# Patient Record
Sex: Female | Born: 2005 | Hispanic: Yes | Marital: Single | Smoking: Never smoker
Health system: Southern US, Community
[De-identification: ages and names within clinical notes are randomized; demographics above are authoritative.]

## PROBLEM LIST (undated history)

## (undated) DIAGNOSIS — G43909 Migraine, unspecified, not intractable, without status migrainosus: Secondary | ICD-10-CM

## (undated) HISTORY — DX: Migraine, unspecified, not intractable, without status migrainosus: G43.909

## (undated) HISTORY — PX: OTHER SURGICAL HISTORY: SHX169

---
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
## 2024-01-26 ENCOUNTER — Other Ambulatory Visit: Payer: Self-pay | Admitting: Family

## 2024-01-26 DIAGNOSIS — N946 Dysmenorrhea, unspecified: Secondary | ICD-10-CM

## 2024-02-09 ENCOUNTER — Encounter: Payer: Self-pay | Admitting: Family

## 2024-04-11 ENCOUNTER — Ambulatory Visit (INDEPENDENT_AMBULATORY_CARE_PROVIDER_SITE_OTHER): Payer: Self-pay | Admitting: Pediatrics

## 2024-04-20 ENCOUNTER — Other Ambulatory Visit: Payer: Self-pay | Admitting: Family

## 2024-04-20 DIAGNOSIS — N946 Dysmenorrhea, unspecified: Secondary | ICD-10-CM

## 2024-04-24 ENCOUNTER — Encounter (INDEPENDENT_AMBULATORY_CARE_PROVIDER_SITE_OTHER): Payer: Self-pay | Admitting: Pediatrics

## 2024-04-24 ENCOUNTER — Ambulatory Visit (INDEPENDENT_AMBULATORY_CARE_PROVIDER_SITE_OTHER): Payer: Self-pay | Admitting: Pediatrics

## 2024-04-24 VITALS — BP 100/70 | HR 84 | Ht 60.12 in | Wt 120.8 lb

## 2024-04-24 DIAGNOSIS — G43009 Migraine without aura, not intractable, without status migrainosus: Secondary | ICD-10-CM

## 2024-04-24 DIAGNOSIS — G44209 Tension-type headache, unspecified, not intractable: Secondary | ICD-10-CM | POA: Diagnosis not present

## 2024-04-24 MED ORDER — NERIVIO DEVI: 12 refills | Status: AC

## 2024-04-24 MED ORDER — ONDANSETRON HCL 8 MG PO TABS: 8.0000 mg | ORAL_TABLET | Freq: Three times a day (TID) | ORAL | 5 refills | Status: AC | PRN

## 2024-04-24 MED ORDER — RIZATRIPTAN BENZOATE 10 MG PO TBDP: 10.0000 mg | ORAL_TABLET | ORAL | 0 refills | Status: AC | PRN

## 2024-04-24 NOTE — Progress Notes (Signed)
 Patient: Alexis Anderson MRN: [id number] Sex: female DOB: 2006/09/12  Provider: Asberry Moles, NP Location of Care: [facility]  Note type: Routine follow-up  History of Present Illness:  Alexis Anderson is a 18 y.o. female with history of migraine without aura and tension-type headache who I am seeing for routine follow-up. Patient was last seen on 10/10/2023 where Alexis Anderson was discussed for headache prevention.  Since the last appointment, she reports headaches have continued averaging once per week to once every other week. When she experiences headache she will take benadryl  and zofran  and headache resolves. She reports wearing hair up at work could trigger headaches. She is sleeping well at night. She has a good appetite and drinks water. She has been travelling for fun. They were unable to obtain Alexis Anderson due to cost after last appointment and would like more information to see if coverage has changed now. No other questions or concerns for today's visit.   Patient presents today with mother.     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 135 tablet 1   ibuprofen  (ADVIL ) 200 MG tablet Take 200 mg by mouth every 6 (six) hours as needed.     ondansetron  (ZOFRAN ) 8 MG tablet Take 1 tablet (8 mg total) by mouth every 8 (eight) hours as needed. 20 tablet 0   ZAFEMY 150-35 MCG/24HR transdermal patch APPLY 1 PATCH TOPICALLY ONCE A WEEK 12 patch 0   No current facility-administered medications on file prior  to visit.    Birth History she was born full-term via c-section delivery with no perinatal events. her birth weight was 6 lbs. 5oz.    Developmental history: she achieved developmental milestone at appropriate age.    Family History family history includes Alcohol abuse in her father; Arthritis in her maternal grandmother; Heart disease in her mother; Hypertension in her mother; Learning disabilities in her brother.  There is no family history of speech delay, learning difficulties in school, intellectual disability, epilepsy or neuromuscular disorders.   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  HENT: Negative for congestion, ear pain, hearing loss, sinus pain and sore throat.   Eyes: Negative for blurred vision, double vision, photophobia, discharge and redness.  Respiratory: Negative for cough, shortness of breath and wheezing.   Cardiovascular: Negative for chest pain, palpitations and leg swelling.  Gastrointestinal: Negative for abdominal pain, blood in stool, constipation, nausea and vomiting.  Genitourinary: Negative for dysuria and frequency.  Musculoskeletal: Negative for back pain, falls, joint pain and neck pain.  Skin: Negative for rash.  Neurological: Negative for dizziness, tremors, focal weakness, seizures, weakness and headaches.  Psychiatric/Behavioral: Negative for memory loss. The patient is not nervous/anxious and does not have insomnia.   Physical Exam BP  100/70 (BP Location: Right Arm, Patient Position: Sitting, Cuff Size: Normal)   Pulse 84   Ht 5' 0.12 (1.527 m)   Wt 120 lb 12.8 oz (54.8 kg)   LMP 03/28/2024 (Approximate)   BMI 23.50 kg/m   Gen: well appearing female Skin: No rash, No  neurocutaneous stigmata. HEENT: Normocephalic, no dysmorphic features, no conjunctival injection, nares patent, mucous membranes moist, oropharynx clear. Neck: Supple, no meningismus. No focal tenderness. Resp: Clear to auscultation bilaterally CV: Regular rate, normal S1/S2, no murmurs, no rubs Abd: BS present, abdomen soft, non-tender, non-distended. No hepatosplenomegaly or mass Ext: Warm and well-perfused. No deformities, no muscle wasting, ROM full.  Neurological Examination: MS: Awake, alert, interactive. Normal eye contact, answered the questions appropriately for age, speech was fluent,  Normal comprehension.  Attention and concentration were normal. Cranial Nerves: Pupils were equal and reactive to light;  EOM normal, no nystagmus; no ptsosis, intact facial sensation, face symmetric with full strength of facial muscles, palate elevation is symmetric.  Sternocleidomastoid and trapezius are with normal strength. Motor-Normal tone throughout, Normal strength in all muscle groups. No abnormal movements Sensation: Intact to light touch throughout.  Romberg negative. Coordination: No dysmetria on FTN test. Fine finger movements and rapid alternating movements are within normal range.  Mirror movements are not present.  There is no evidence of tremor, dystonic posturing or any abnormal movements.No difficulty with balance when standing on one foot bilaterally.   Gait: Normal gait. Tandem gait was normal.   Assessment 1. Migraine without aura and without status migrainosus, not intractable   2. Tension-type headache, not intractable, unspecified chronicity pattern     Alexis Anderson is a 18 y.o. female with history of migraine without aura and tension-type headache who presents for follow-up evaluation. She has averaged around 1 headache per week since last appointment that can be resolved with combination of benadryl  and zofran . Physical and neurological exam unremarkable. Would recommend  Alexis Anderson for headache prevention. Provided information to family to see if coverage has changed as previously insurance did not cover device. At onset of severe headache can use Alexis Anderson  for relief. Counseled on dose and frequency. Encouraged to have adequate sleep, hydration, and limited screen time for headache prevention. Follow-up in 6 months.    PLAN: Alexis Anderson for headache prevention At onset of severe headache can use Alexis Anderson  for relief Have appropriate hydration and sleep and limited screen time Make a headache diary May take occasional Tylenol  or ibuprofen  for moderate to severe headache, maximum 2 or 3 times a week Return for follow-up visit in 6 months    Counseling/Education: medication dose and administration   Total time spent with the patient was 30 minutes, of which 50% or more was spent in counseling and coordination of care.   The plan of care was discussed, with acknowledgement of understanding expressed by her mother.   Asberry Moles, DNP, CPNP-PC [facility] Pediatric Neurology  [phone] [address], [place], [place] [address] Phone: [phone]

## 2024-06-29 ENCOUNTER — Encounter: Payer: Self-pay | Admitting: *Deleted

## 2024-07-09 ENCOUNTER — Other Ambulatory Visit: Payer: Self-pay | Admitting: Family

## 2024-07-09 DIAGNOSIS — N946 Dysmenorrhea, unspecified: Secondary | ICD-10-CM

## 2024-07-11 ENCOUNTER — Encounter (INDEPENDENT_AMBULATORY_CARE_PROVIDER_SITE_OTHER): Payer: Self-pay

## 2024-07-12 ENCOUNTER — Encounter (INDEPENDENT_AMBULATORY_CARE_PROVIDER_SITE_OTHER): Payer: Self-pay

## 2024-07-23 ENCOUNTER — Telehealth: Payer: Self-pay | Admitting: Pediatrics

## 2024-07-23 ENCOUNTER — Ambulatory Visit: Admitting: Pediatrics

## 2024-07-23 ENCOUNTER — Telehealth: Payer: Self-pay | Admitting: Family

## 2024-07-23 ENCOUNTER — Encounter: Payer: Self-pay | Admitting: Pediatrics

## 2024-07-23 VITALS — BP 104/68 | Ht 60.25 in | Wt 123.6 lb

## 2024-07-23 DIAGNOSIS — Z68.41 Body mass index (BMI) pediatric, 5th percentile to less than 85th percentile for age: Secondary | ICD-10-CM

## 2024-07-23 DIAGNOSIS — Z0000 Encounter for general adult medical examination without abnormal findings: Secondary | ICD-10-CM | POA: Diagnosis not present

## 2024-07-23 NOTE — Progress Notes (Unsigned)
 Subjective:     Alexis Anderson is a 18 y.o. female and is here for a comprehensive physical exam. The patient reports no problems.  Social History   Socioeconomic History   Marital status: Single    Spouse name: Not on file   Number of children: Not on file   Years of education: Not on file   Highest education level: Not on file  Occupational History   Not on file  Tobacco Use   Smoking status: Never    Passive exposure: Never   Smokeless tobacco: Never   Tobacco comments:    mom using Vaps  Vaping Use   Vaping status: Never Used  Substance and Sexual Activity   Alcohol use: Never   Drug use: Never   Sexual activity: Never    Birth control/protection: None  Other Topics Concern   Not on file  Social History Narrative   Seena is biologic father, has never been involved      Lives with mom and 4 siblings    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   Social Drivers of Health   Financial Resource Strain: Not on file  Food Insecurity: Not on file  Transportation Needs: Not on file  Physical Activity: Not on file  Stress: Not on file  Social Connections: Not on file  Intimate Partner Violence: Not on file   Health Maintenance  Topic Date Due   HIV Screening  Never done   Hepatitis C Screening  Never done   Influenza Vaccine  05/11/2024   COVID-19 Vaccine (3 - 2025-26 season) 06/11/2024   DTaP/Tdap/Td (5 - Td or Tdap) 09/10/2026   Pneumococcal Vaccine  Completed   Hepatitis B Vaccines 19-59 Average Risk  Completed   HPV VACCINES  Completed   Meningococcal B Vaccine  Completed    The following portions of the patient's history were reviewed and updated as appropriate: allergies, current medications, past family history, past medical history, past social history, past surgical history, and problem list.  Review of Systems Pertinent items are noted in HPI.   Objective:    BP 104/68   Ht 5' 0.25 (1.53 m)   Wt 123 lb 9 oz (56 kg)   BMI 23.93  kg/m  General appearance: alert, cooperative, appears stated age, and no distress Head: Normocephalic, without obvious abnormality, atraumatic Eyes: conjunctivae/corneas clear. PERRL, EOM's intact. Fundi benign. Ears: normal TM's and external ear canals both ears Nose: Nares normal. Septum midline. Mucosa normal. No drainage or sinus tenderness. Throat: lips, mucosa, and tongue normal; teeth and gums normal Neck: no adenopathy, no carotid bruit, no JVD, supple, symmetrical, trachea midline, and thyroid not enlarged, symmetric, no tenderness/mass/nodules Lungs: clear to auscultation bilaterally Heart: regular rate and rhythm, S1, S2 normal, no murmur, click, rub or gallop and normal apical impulse Abdomen: soft, non-tender; bowel sounds normal; no masses,  no organomegaly Extremities: extremities normal, atraumatic, no cyanosis or edema Skin: Skin color, texture, turgor normal. No rashes or lesions Neurologic: Grossly normal    Assessment:    Healthy female exam.      Plan:    Discussed transition primary care to family practice See After Visit Summary for Counseling Recommendations

## 2024-07-23 NOTE — Telephone Encounter (Signed)
 Patient stated she gave the wrong pharmacy during today's wellness visit. Patient states she would like any prescriptions sent to the [facility] in [place] located at [address]

## 2024-07-23 NOTE — Patient Instructions (Signed)
 At [facility] we value your feedback. You may receive a survey about your visit today. Please share your experience as we strive to create trusting relationships with our patients to provide genuine, compassionate, quality care.   Preventive Care 43-19 Years Old, Female Preventive care refers to lifestyle choices and visits with your health care provider that can promote health and wellness. At this stage in your life, you may start seeing a primary care physician instead of a pediatrician for your preventive care. Preventive care visits are also called wellness exams. What can I expect for my preventive care visit? Counseling During your preventive care visit, your health care provider may ask about your: Medical history, including: Past medical problems. Family medical history. Pregnancy history. Current health, including: Menstrual cycle. Method of birth control. Emotional well-being. Home life and relationship well-being. Sexual activity and sexual health. Lifestyle, including: Alcohol, nicotine or tobacco, and drug use. Access to firearms. Diet, exercise, and sleep habits. Sunscreen use. Motor vehicle safety. Physical exam Your health care provider may check your: Height and weight. These may be used to calculate your BMI (body mass index). BMI is a measurement that tells if you are at a healthy weight. Waist circumference. This measures the distance around your waistline. This measurement also tells if you are at a healthy weight and may help predict your risk of certain diseases, such as type 2 diabetes and high blood pressure. Heart rate and blood pressure. Body temperature. Skin for abnormal spots. Breasts. What immunizations do I need? Vaccines are usually given at various ages, according to a schedule. Your health care provider will recommend vaccines for you based on your age, medical history, and lifestyle or other factors, such as travel or where you  work. What tests do I need? Screening Your health care provider may recommend screening tests for certain conditions. This may include: Vision and hearing tests. Lipid and cholesterol levels. Pelvic exam and Pap test. Hepatitis B test. Hepatitis C test. HIV (human immunodeficiency virus) test. STI (sexually transmitted infection) testing, if you are at risk. Tuberculosis skin test if you have symptoms. BRCA-related cancer screening. This may be done if you have a family history of breast, ovarian, tubal, or peritoneal cancers. Talk with your health care provider about your test results, treatment options, and if necessary, the need for more tests. Follow these instructions at home: Eating and drinking Eat a healthy diet that includes fresh fruits and vegetables, whole grains, lean protein, and low-fat dairy products. Drink enough fluid to keep your urine pale yellow. Do not drink alcohol if: Your health care provider tells you not to drink. You are pregnant, may be pregnant, or are planning to become pregnant. You are under the legal drinking age. In the [place] the legal drinking age is 19. If you drink alcohol: Limit how much you have to 0-1 drink a day. Know how much alcohol is in your drink. In the [place] one drink equals one 12 oz bottle of beer (355 mL), one 5 oz glass of wine (148 mL), or one 1 oz glass of hard liquor (44 mL). Lifestyle Brush your teeth every morning and night with fluoride toothpaste. Floss one time each day. Exercise for at least 30 minutes 19 or more days of the week. Do not use any products that contain nicotine or tobacco. These products include cigarettes, chewing tobacco, and vaping devices, such as e-cigarettes. If you need help quitting, ask your health care provider. Do not use drugs. If you are  sexually active, practice safe sex. Use a condom or other form of protection to prevent STIs. If you do not wish to become pregnant, use a form of birth control.  If you plan to become pregnant, see your health care provider for a prepregnancy visit. Find healthy ways to manage stress, such as: Meditation, yoga, or listening to music. Journaling. Talking to a trusted person. Spending time with friends and family. Safety Always wear your seat belt while driving or riding in a vehicle. Do not drive: If you have been drinking alcohol. Do not ride with someone who has been drinking. When you are tired or distracted. While texting. If you have been using any mind-altering substances or drugs. Wear a helmet and other protective equipment during sports activities. If you have firearms in your house, make sure you follow all gun safety procedures. Seek help if you have been bullied, physically abused, or sexually abused. Use the internet responsibly to avoid dangers, such as online bullying and online sex predators. What's next? Go to your health care provider once a year for an annual wellness visit. Ask your health care provider how often you should have your eyes and teeth checked. Stay up to date on all vaccines. This information is not intended to replace advice given to you by your health care provider. Make sure you discuss any questions you have with your health care provider. Document Revised: 03/25/2021 Document Reviewed: 03/25/2021 Elsevier Patient Education  2024 [employer].

## 2024-07-23 NOTE — Telephone Encounter (Signed)
 CALL BACK NUMBER:  [phone]  MEDICATION(S): ZAFEMY  PREFERRED PHARMACY: [address] [place] [address]  ARE YOU CURRENTLY COMPLETELY OUT OF THE MEDICATION? :  yes

## 2024-07-24 NOTE — Telephone Encounter (Signed)
Pharmacy updated in patient's chart.

## 2024-07-26 NOTE — Telephone Encounter (Signed)
 Spoke to [place] and she will call back Monday to schedule an appointment after she gets her new work schedule.

## 2024-07-31 ENCOUNTER — Other Ambulatory Visit: Payer: Self-pay | Admitting: Family

## 2024-07-31 DIAGNOSIS — N946 Dysmenorrhea, unspecified: Secondary | ICD-10-CM

## 2024-10-22 ENCOUNTER — Ambulatory Visit (INDEPENDENT_AMBULATORY_CARE_PROVIDER_SITE_OTHER): Payer: Self-pay | Admitting: Pediatrics
